# Patient Record
Sex: Male | Born: 1990 | Race: Black or African American | Hispanic: No | Marital: Single | Smoking: Current some day smoker
Health system: Southern US, Community
[De-identification: ages and names within clinical notes are randomized; demographics above are authoritative.]

---
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
## 2010-12-30 ENCOUNTER — Inpatient Hospital Stay (INDEPENDENT_AMBULATORY_CARE_PROVIDER_SITE_OTHER)
Admission: RE | Admit: 2010-12-30 | Discharge: 2010-12-30 | Disposition: A | Discharge status: Home / Self Care | Payer: Self-pay | Source: Ambulatory Visit | Attending: Family Medicine | Admitting: Family Medicine

## 2010-12-30 DIAGNOSIS — J020 Streptococcal pharyngitis: Secondary | ICD-10-CM

## 2010-12-30 LAB — POCT RAPID STREP A (OFFICE): Streptococcus, Group A Screen (Direct): POSITIVE — AB

## 2011-06-22 LAB — URINALYSIS, ROUTINE W REFLEX MICROSCOPIC
Bilirubin Urine: NEGATIVE
Glucose, UA: NEGATIVE
Hgb urine dipstick: NEGATIVE
Ketones, ur: NEGATIVE
Nitrite: NEGATIVE
Protein, ur: NEGATIVE
Specific Gravity, Urine: 1.03
Urobilinogen, UA: 1
pH: 6.5

## 2011-06-22 LAB — RAPID STREP SCREEN (MED CTR MEBANE ONLY): Streptococcus, Group A Screen (Direct): NEGATIVE

## 2011-07-02 LAB — CULTURE, ROUTINE-ABSCESS: Gram Stain: NONE SEEN

## 2011-07-04 ENCOUNTER — Emergency Department (HOSPITAL_COMMUNITY)
Admission: EM | Admit: 2011-07-04 | Discharge: 2011-07-04 | Disposition: A | Discharge status: Home / Self Care | Payer: Medicaid Other | Attending: Emergency Medicine | Admitting: Emergency Medicine

## 2011-07-04 DIAGNOSIS — H6120 Impacted cerumen, unspecified ear: Secondary | ICD-10-CM | POA: Insufficient documentation

## 2011-07-04 DIAGNOSIS — H9209 Otalgia, unspecified ear: Secondary | ICD-10-CM | POA: Insufficient documentation

## 2011-09-19 ENCOUNTER — Emergency Department (INDEPENDENT_AMBULATORY_CARE_PROVIDER_SITE_OTHER)
Admission: EM | Admit: 2011-09-19 | Discharge: 2011-09-19 | Disposition: A | Discharge status: Home / Self Care | Payer: Medicaid Other | Source: Home / Self Care | Attending: Family Medicine | Admitting: Family Medicine

## 2011-09-19 ENCOUNTER — Emergency Department (INDEPENDENT_AMBULATORY_CARE_PROVIDER_SITE_OTHER): Payer: Medicaid Other

## 2011-09-19 DIAGNOSIS — S93409A Sprain of unspecified ligament of unspecified ankle, initial encounter: Secondary | ICD-10-CM

## 2011-09-19 MED ORDER — IBUPROFEN 800 MG PO TABS: 800.0000 mg | ORAL_TABLET | Freq: Three times a day (TID) | ORAL | Status: AC

## 2011-09-19 NOTE — ED Provider Notes (Signed)
History     CSN: [id number] Arrival date & time: 09/19/2011  6:47 PM   First MD Initiated Contact with Patient 09/19/11 1854      Chief Complaint  Patient presents with  . Ankle Injury    (Consider location/radiation/quality/duration/timing/severity/associated sxs/prior treatment) Patient is a 20 y.o. male presenting with lower extremity injury. The history is provided by the patient. No language interpreter was used.  Ankle Injury This is a new problem. The current episode started 6 to 12 hours ago. The problem occurs constantly. The problem has not changed since onset.The symptoms are aggravated by walking. The symptoms are relieved by relaxation. He has tried rest for the symptoms. The treatment provided no relief.  Pt turned ankle walking on gravel.  Pt complains of swelling and pain.   Past Medical History  Diagnosis Date  . Asthma     History reviewed. No pertinent past surgical history.  No family history on file.  History  Substance Use Topics  . Smoking status: Never Smoker   . Smokeless tobacco: Not on file  . Alcohol Use: No      Review of Systems  Musculoskeletal: Positive for myalgias and joint swelling.  All other systems reviewed and are negative.    Allergies  Review of patient's allergies indicates no known allergies.  Home Medications   Current Outpatient Rx  Name Route Sig Dispense Refill  . ALBUTEROL IN Inhalation Inhale into the lungs as needed.        BP 134/85  Pulse 95  Temp(Src) 98.3 F (36.8 C) (Oral)  Resp 18  SpO2 98%  Physical Exam  Vitals reviewed. Constitutional: He appears well-developed and well-nourished.  HENT:  Head: Normocephalic.  Musculoskeletal: He exhibits edema and tenderness.       Swollen left ankle,  Decreased range of motion,  nv and ns intact, tender achilles,  Achilles intact  Neurological: He is alert.  Skin: Skin is warm and dry.  Psychiatric: He has a normal mood and affect.    ED Course    Procedures (including critical care time)  Labs Reviewed - No data to display Dg Ankle Complete Left  09/19/2011  *RADIOLOGY REPORT*  Clinical Data: Ankle injury.  Pain and swelling  LEFT ANKLE COMPLETE - 3+ VIEW  Comparison: None.  Findings: Negative for fracture.  Normal alignment.  Normal joint space.  Soft tissue swelling is present.  IMPRESSION: Negative for fracture.  Original Report Authenticated By: Camelia Phenes, M.D.     No diagnosis found.    MDM  No fx,  Pt placed in aso and crutches.  I advised follow up with Dr. Ave Filter for recheck in 1 week.        Langston Masker, [place] 09/19/11 [phone]

## 2011-09-19 NOTE — ED Notes (Signed)
Pt states he rolled his lt ankle while walking on gravel surface at work on Sunday morning.  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  C/o pain and swelling to lt ankle.  Swelling and pain is worse in medial aspect of lt ankle.  Increased pain with weight bearing.

## 2011-09-20 NOTE — ED Provider Notes (Signed)
Medical screening examination/treatment/procedure(s) were performed by non-physician practitioner and as supervising physician I was immediately available for consultation/collaboration.   Shakesha Soltau DOUGLAS MD.    Ellanora Rayborn Douglas Arianny Pun, MD 09/20/11 1703 

## 2012-07-20 ENCOUNTER — Encounter (HOSPITAL_COMMUNITY): Payer: Self-pay | Admitting: Emergency Medicine

## 2012-07-20 ENCOUNTER — Emergency Department (HOSPITAL_COMMUNITY): Payer: Self-pay

## 2012-07-20 ENCOUNTER — Emergency Department (HOSPITAL_COMMUNITY)
Admission: EM | Admit: 2012-07-20 | Discharge: 2012-07-20 | Disposition: A | Discharge status: Home / Self Care | Payer: Self-pay | Attending: Emergency Medicine | Admitting: Emergency Medicine

## 2012-07-20 DIAGNOSIS — R079 Chest pain, unspecified: Secondary | ICD-10-CM | POA: Insufficient documentation

## 2012-07-20 DIAGNOSIS — R0602 Shortness of breath: Secondary | ICD-10-CM | POA: Insufficient documentation

## 2012-07-20 DIAGNOSIS — R110 Nausea: Secondary | ICD-10-CM | POA: Insufficient documentation

## 2012-07-20 DIAGNOSIS — J189 Pneumonia, unspecified organism: Secondary | ICD-10-CM | POA: Insufficient documentation

## 2012-07-20 DIAGNOSIS — R000 Tachycardia, unspecified: Secondary | ICD-10-CM | POA: Insufficient documentation

## 2012-07-20 DIAGNOSIS — R509 Fever, unspecified: Secondary | ICD-10-CM | POA: Insufficient documentation

## 2012-07-20 LAB — CBC WITH DIFFERENTIAL/PLATELET
Basophils Absolute: 0 10*3/uL (ref 0.0–0.1)
Eosinophils Absolute: 0 10*3/uL (ref 0.0–0.7)
Lymphocytes Relative: 13 % (ref 12–46)
Lymphs Abs: 1.1 10*3/uL (ref 0.7–4.0)
MCH: 28.8 pg (ref 26.0–34.0)
Neutrophils Relative %: 71 % (ref 43–77)
Platelets: 445 10*3/uL — ABNORMAL HIGH (ref 150–400)
RBC: 5.2 MIL/uL (ref 4.22–5.81)
RDW: 12.9 % (ref 11.5–15.5)
WBC: 8 10*3/uL (ref 4.0–10.5)

## 2012-07-20 LAB — BASIC METABOLIC PANEL
Calcium: 9.4 mg/dL (ref 8.4–10.5)
GFR calc non Af Amer: >90 mL/min (ref >90)
Glucose, Bld: 99 mg/dL (ref 70–99)
Potassium: 4.1 mEq/L (ref 3.5–5.1)
Sodium: 139 mEq/L (ref 135–145)

## 2012-07-20 LAB — POCT I-STAT TROPONIN I: Troponin i, poc: 0.01 ng/mL (ref 0.00–0.08)

## 2012-07-20 MED ORDER — ACETAMINOPHEN 325 MG PO TABS
650.0000 mg | ORAL_TABLET | Freq: Once | ORAL | Status: AC
  Administered 2012-07-20: 650 mg via ORAL
  Filled 2012-07-20: qty 2

## 2012-07-20 MED ORDER — AZITHROMYCIN 250 MG PO TABS: ORAL_TABLET | ORAL | Status: DC

## 2012-07-20 MED ORDER — SODIUM CHLORIDE 0.9 % IV BOLUS (SEPSIS): 1000.0000 mL | Freq: Once | INTRAVENOUS | Status: DC

## 2012-07-20 MED ORDER — SODIUM CHLORIDE 0.9 % IV BOLUS (SEPSIS)
1000.0000 mL | Freq: Once | INTRAVENOUS | Status: AC
  Administered 2012-07-20: 1000 mL via INTRAVENOUS

## 2012-07-20 MED ORDER — IBUPROFEN 200 MG PO TABS
600.0000 mg | ORAL_TABLET | Freq: Once | ORAL | Status: AC
  Administered 2012-07-20: 600 mg via ORAL
  Filled 2012-07-20: qty 3

## 2012-07-20 MED ORDER — DEXTROSE 5 % IV SOLN
500.0000 mg | Freq: Once | INTRAVENOUS | Status: AC
  Administered 2012-07-20: 500 mg via INTRAVENOUS
  Filled 2012-07-20: qty 500

## 2012-07-20 NOTE — ED Notes (Signed)
Pt c/o left chest pain that radiates from left side to  Left upper chest onset today when he got to work. Pt also reports nausea.

## 2012-07-20 NOTE — ED Provider Notes (Signed)
History     CSN: [id number]  Arrival date & time 07/20/12  1555   First MD Initiated Contact with Patient 07/20/12 2021      Chief Complaint  Patient presents with  . Chest Pain    (Consider location/radiation/quality/duration/timing/severity/associated sxs/prior treatment) HPI Comments: 21 y/o male presents to the ED complaining of sudden onset left sided lateral chest pain radiating across his left upper chest to the right side while at work today. He works at [employer]. States pain is sharp and intermittent, coming and going every hour. Admits to fever, chills, nausea, shortness of breath on exertion and non-productive cough. Denies vomiting, dizziness, abdominal pain, rashes, urinary symptoms or confusion. Has not tried any alleviating factors for his pain. Admits to being a smoker. Denies any sick contacts.  Patient is a 21 y.o. male presenting with chest pain. The history is provided by the patient.  Chest Pain Primary symptoms include a fever, shortness of breath, cough and nausea. Pertinent negatives for primary symptoms include no abdominal pain, no vomiting and no dizziness.  Pertinent negatives for associated symptoms include no weakness.     Past Medical History  Diagnosis Date  . Asthma     History reviewed. No pertinent past surgical history.  No family history on file.  History  Substance Use Topics  . Smoking status: Current Some Day Smoker    Types: Cigarettes  . Smokeless tobacco: Not on file  . Alcohol Use: No      Review of Systems  Constitutional: Positive for fever and chills.  HENT: Negative for congestion, neck stiffness and ear discharge.   Respiratory: Positive for cough and shortness of breath.   Cardiovascular: Positive for chest pain.  Gastrointestinal: Positive for nausea. Negative for vomiting and abdominal pain.  Genitourinary: Negative for dysuria and difficulty urinating.  Musculoskeletal: Negative for back pain.  Skin: Negative  for rash.  Neurological: Negative for dizziness, weakness and light-headedness.  Psychiatric/Behavioral: Negative for confusion.    Allergies  Review of patient's allergies indicates no known allergies.  Home Medications   Current Outpatient Rx  Name Route Sig Dispense Refill  . IBUPROFEN 200 MG PO TABS Oral Take 400 mg by mouth every 6 (six) hours as needed. For pain      BP 134/85  Pulse 108  Temp 103 F (39.4 C) (Oral)  Resp 18  SpO2 99%  Physical Exam  Nursing note and vitals reviewed. Constitutional: He is oriented to person, place, and time. He appears well-developed. No distress.       Obese  HENT:  Head: Normocephalic and atraumatic.  Nose: Nose normal.  Mouth/Throat: Oropharynx is clear and moist. No oropharyngeal exudate.  Eyes: Conjunctivae normal and EOM are normal.  Neck: Normal range of motion. Neck supple.  Cardiovascular: Regular rhythm, normal heart sounds and intact distal pulses.  Tachycardia present.   Pulmonary/Chest: Effort normal. No respiratory distress. He has no decreased breath sounds. He has rhonchi (right mid-lower lung fields posterior > anterior). He exhibits no tenderness.  Abdominal: Soft. Bowel sounds are normal. There is no tenderness.  Musculoskeletal: Normal range of motion.  Neurological: He is alert and oriented to person, place, and time.  Skin: Skin is warm and intact. No rash noted. No cyanosis.       Very warm  Psychiatric: He has a normal mood and affect. His behavior is normal.    ED Course  Procedures (including critical care time)  Labs Reviewed  CBC WITH DIFFERENTIAL - Abnormal; Notable  for the following:    Platelets 445 (*)     Monocytes Relative 15 (*)     Monocytes Absolute 1.2 (*)     All other components within normal limits  BASIC METABOLIC PANEL  POCT I-STAT TROPONIN I   Dg Chest 2 View  07/20/2012  *RADIOLOGY REPORT*  Clinical Data: Chest pain and shortness of breath and cough.  CHEST - 2 VIEW   Comparison: 10/22/2007  Findings: The patient has a consolidative pneumonia in the lateral segment of the right middle lobe.  Heart size and vascularity are normal.  Left lung is clear.  No effusions.  IMPRESSION: Right middle lobe pneumonia.   Original Report Authenticated By: Gwynn Burly, M.D.      1. Community acquired pneumonia   2. Fever       MDM  21 y/o male with right middle lobe pneumonia. Azithromycin IV given in ED. Azithromycin PO given for discharge. Ibuprofen given for fever control and temp at discharge 98.4. He states he is feeling better. He appears in NAD and is in no respiratory distress. Close return precautions discussed. Advised f/u with PCP in 1 week.        Trevor Mace, PA-C 07/20/12 2221

## 2012-07-20 NOTE — ED Provider Notes (Signed)
Medical screening examination/treatment/procedure(s) were performed by non-physician practitioner and as supervising physician I was immediately available for consultation/collaboration.  Cheri Guppy, MD 07/20/12 [phone]

## 2012-12-15 IMAGING — CR DG CHEST 2V
2 series · 2 of 2 positions shown · non-contrast
Comparison: 10/22/2007

CLINICAL DATA: Chest pain and shortness of breath and cough.

CHEST - 2 VIEW

[w chest pa]
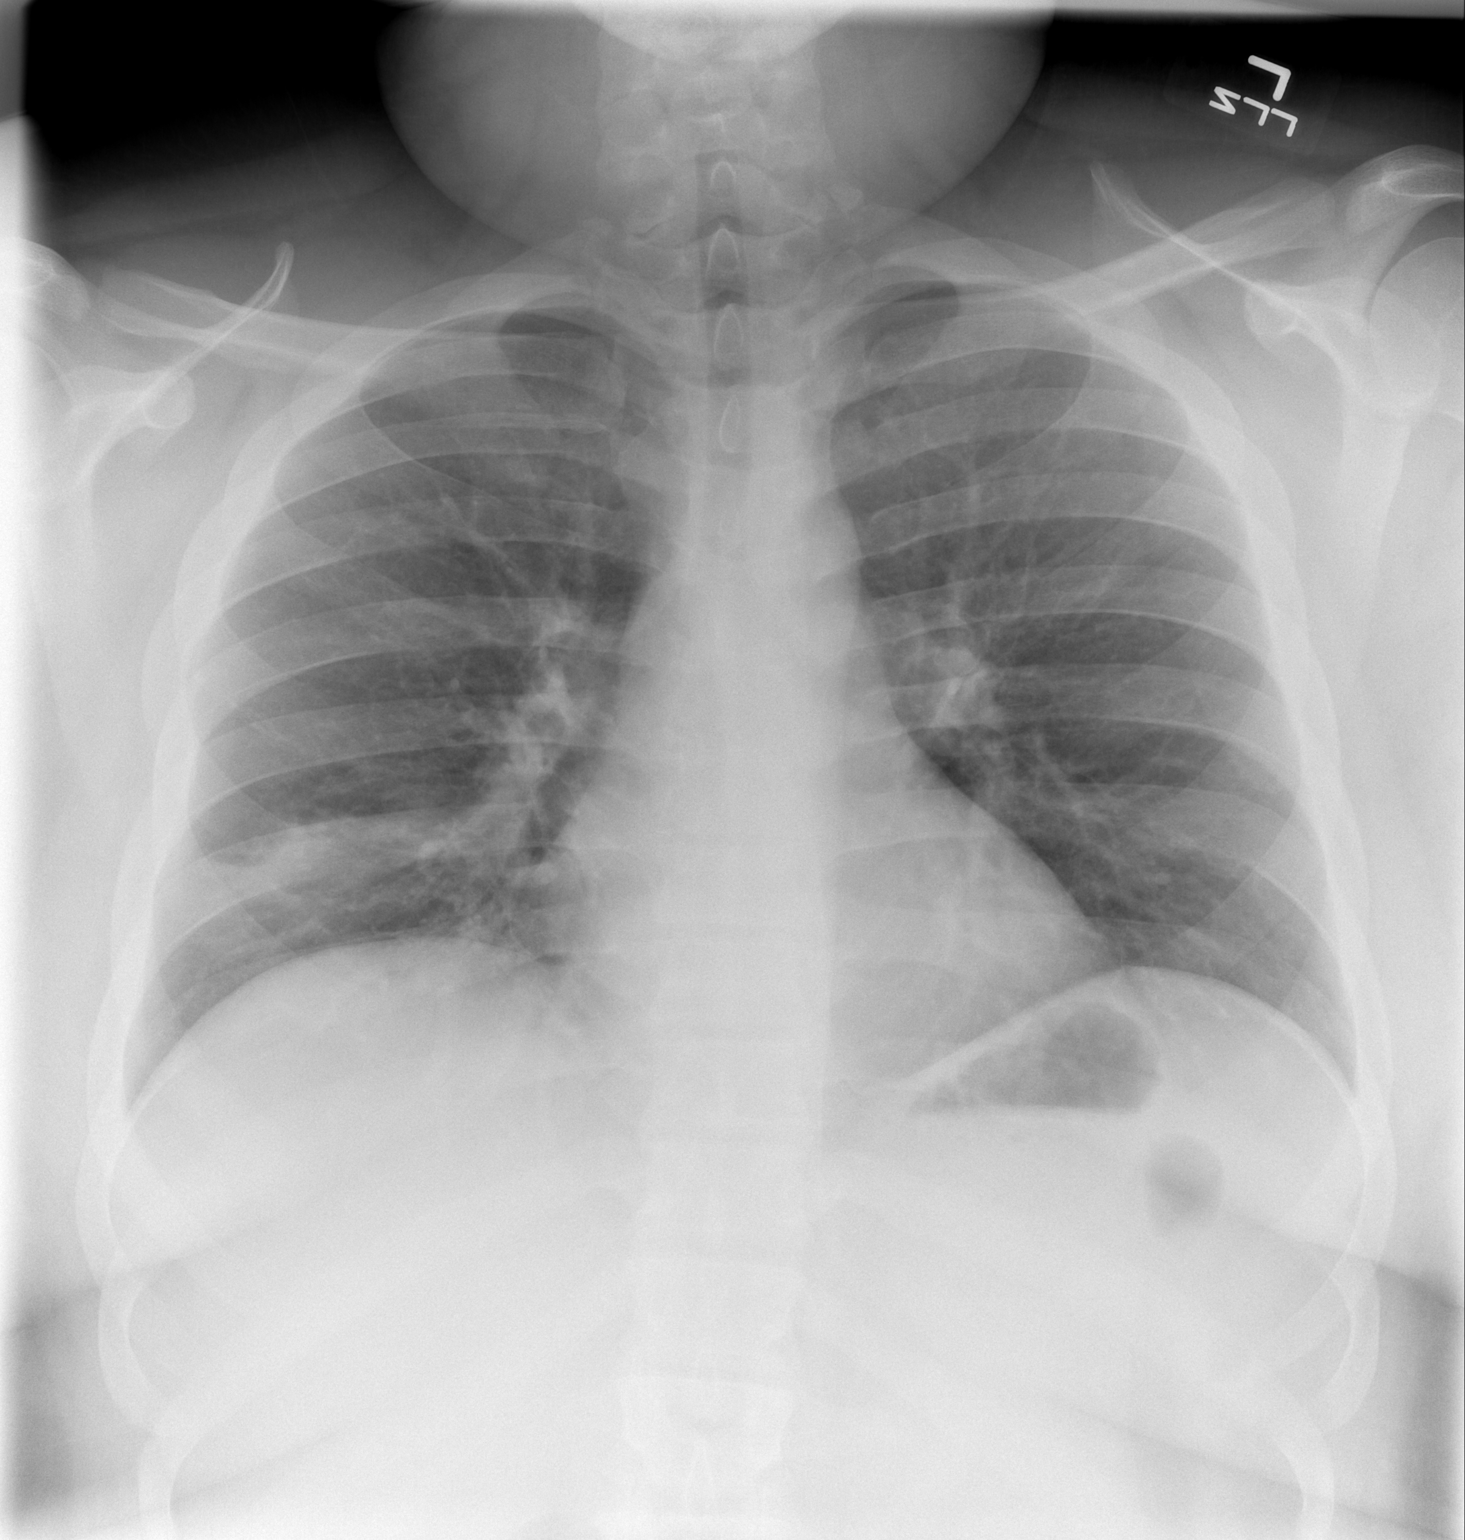

[w chest lat]
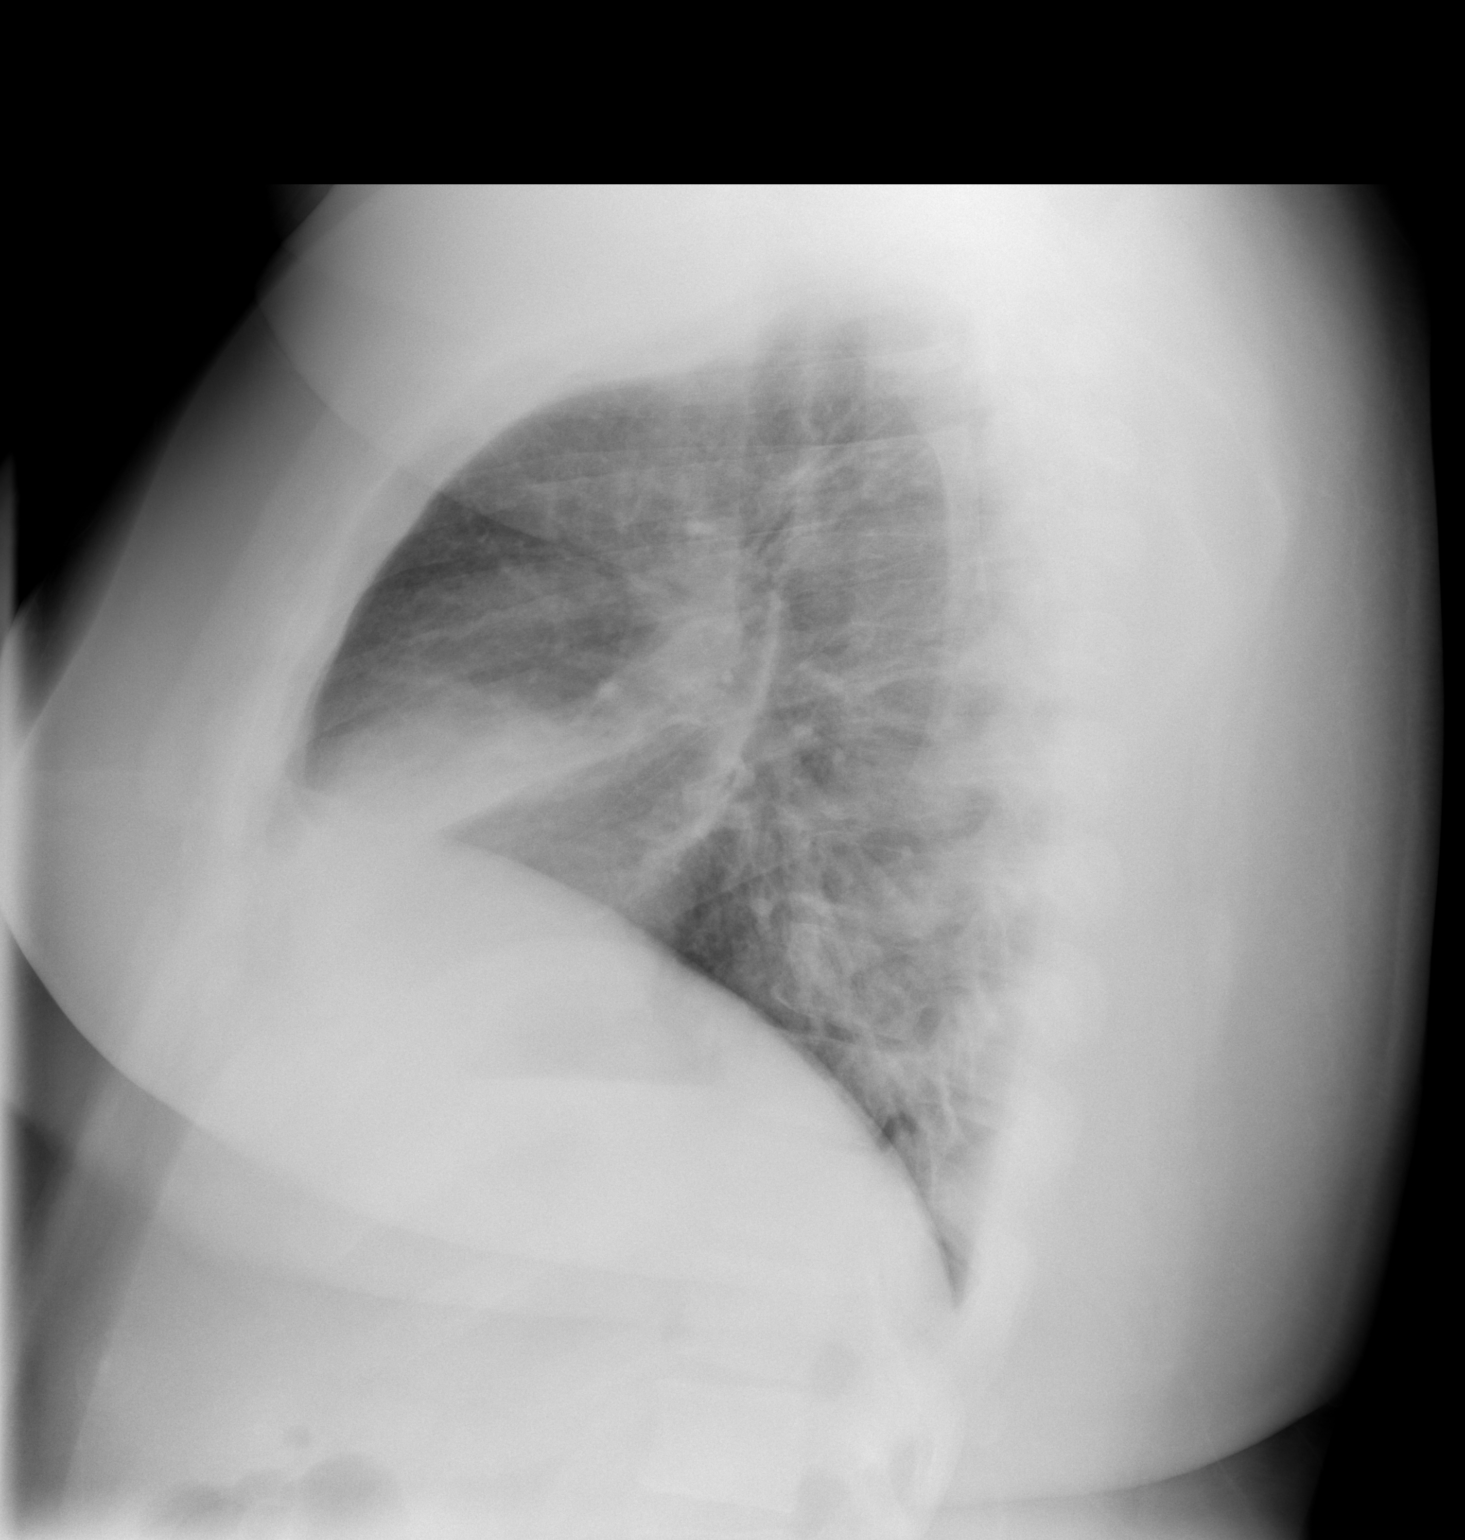

[2 of 2 positions shown; findings below may reference images not displayed]

FINDINGS: The patient has a consolidative pneumonia in the lateral
segment of the right middle lobe.  Heart size and vascularity are
normal.  Left lung is clear.  No effusions.
IMPRESSION: Right middle lobe pneumonia.

## 2013-04-19 ENCOUNTER — Emergency Department (INDEPENDENT_AMBULATORY_CARE_PROVIDER_SITE_OTHER)
Admission: EM | Admit: 2013-04-19 | Discharge: 2013-04-19 | Disposition: A | Discharge status: Home / Self Care | Payer: Self-pay | Source: Home / Self Care

## 2013-04-19 ENCOUNTER — Encounter (HOSPITAL_COMMUNITY): Payer: Self-pay

## 2013-04-19 DIAGNOSIS — M25512 Pain in left shoulder: Secondary | ICD-10-CM

## 2013-04-19 DIAGNOSIS — M25519 Pain in unspecified shoulder: Secondary | ICD-10-CM

## 2013-04-19 DIAGNOSIS — R109 Unspecified abdominal pain: Secondary | ICD-10-CM

## 2013-04-19 MED ORDER — MELOXICAM 15 MG PO TABS: 15.0000 mg | ORAL_TABLET | Freq: Every day | ORAL | Status: AC

## 2013-04-19 MED ORDER — OMEPRAZOLE 20 MG PO CPDR: 20.0000 mg | DELAYED_RELEASE_CAPSULE | Freq: Every day | ORAL | Status: AC

## 2013-04-19 NOTE — ED Provider Notes (Signed)
Jorge Estes is a 22 y.o. male who presents to Urgent Care today for bilateral right worse than left [place] joint pain.  Pain present for 4 days. Patient fell playing basketball yesterday which worsened the pain. He notes the pain is worse with overhand motion and better with rest. He denies any chest pains palpitations or trouble breathing. He has not tried any medications.  Additionally he notes some upper abdominal discomfort and nausea in the mornings. It is not affected by diet. He denies any nausea vomiting or diarrhea or blood in the stool. He has not tried any medications and feels well otherwise.    PMH reviewed. Obese History  Substance Use Topics  . Smoking status: Current Some Day Smoker    Types: Cigarettes  . Smokeless tobacco: Not on file  . Alcohol Use: No   ROS as above Medications reviewed. No current facility-administered medications for this encounter.   Current Outpatient Prescriptions  Medication Sig Dispense Refill  . meloxicam (MOBIC) 15 MG tablet Take 1 tablet (15 mg total) by mouth daily.  30 tablet  0  . omeprazole (PRILOSEC) 20 MG capsule Take 1 capsule (20 mg total) by mouth daily.  30 capsule  2    Exam:  BP 115/69  Pulse 87  Temp(Src) 98.4 F (36.9 C) (Oral)  Resp 17  SpO2 100% Gen: Well NAD HEENT: EOMI,  MMM Lungs: CTABL Nl WOB Heart: RRR no MRG Abd: NABS, NT, ND Exts: Non edematous BL  LE, warm and well perfused.  MSK: Bilateral shoulder exam. Normal-appearing nontender a.c. joints. Bilateral mildly tender [place] joints. Decreased abduction range of motion due to pain. External and internal rotation are normal. Strength is intact in all planes. Negative impingement testing.  Capillary refill sensation and grip strength are intact distally.   No results found for this or any previous visit (from the past 24 hour(s)). No results found.  Assessment and Plan: 22 y.o. male with  1) [place] shoulder pain. Unclear cause. Likely related to  activities. Doubtful for serious process his vital signs are normal chest exam is normal.  Plan for meloxicam for pain control.   2) abdominal pain with nausea: Likely GERD related. Possible gallstone.  Plan for empiric treatment with omeprazole. If not improving will followup with primary care provider.   Discussed warning signs or symptoms. Please see discharge instructions. Patient expresses understanding.      Rodolph Bong, MD 04/19/13 1758

## 2013-04-19 NOTE — ED Notes (Signed)
Bilateral shoulder pain, patients states he fell yesterday playing basketball, however he had shoulder pain prior to fall

## 2014-07-07 ENCOUNTER — Emergency Department (INDEPENDENT_AMBULATORY_CARE_PROVIDER_SITE_OTHER): Payer: Self-pay

## 2014-07-07 ENCOUNTER — Emergency Department (INDEPENDENT_AMBULATORY_CARE_PROVIDER_SITE_OTHER)
Admission: EM | Admit: 2014-07-07 | Discharge: 2014-07-07 | Disposition: A | Discharge status: Home / Self Care | Payer: Self-pay | Source: Home / Self Care | Attending: Family Medicine | Admitting: Family Medicine

## 2014-07-07 ENCOUNTER — Encounter (HOSPITAL_COMMUNITY): Payer: Self-pay | Admitting: Emergency Medicine

## 2014-07-07 DIAGNOSIS — S60221A Contusion of right hand, initial encounter: Secondary | ICD-10-CM

## 2014-07-07 DIAGNOSIS — S63501A Unspecified sprain of right wrist, initial encounter: Secondary | ICD-10-CM

## 2014-07-07 MED ORDER — IBUPROFEN 800 MG PO TABS
800.0000 mg | ORAL_TABLET | Freq: Once | ORAL | Status: AC
  Administered 2014-07-07: 800 mg via ORAL

## 2014-07-07 MED ORDER — IBUPROFEN 600 MG PO TABS: 600.0000 mg | ORAL_TABLET | Freq: Three times a day (TID) | ORAL | Status: DC | PRN

## 2014-07-07 MED ORDER — IBUPROFEN 800 MG PO TABS
ORAL_TABLET | ORAL | Status: AC
  Filled 2014-07-07: qty 1

## 2014-07-07 NOTE — Discharge Instructions (Signed)
Contusion °A contusion is a deep bruise. Contusions are the result of an injury that caused bleeding under the skin. The contusion may turn blue, purple, or yellow. Minor injuries will give you a painless contusion, but more severe contusions may stay painful and swollen for a few weeks.  °CAUSES  °A contusion is usually caused by a blow, trauma, or direct force to an area of the body. °SYMPTOMS  °· Swelling and redness of the injured area. °· Bruising of the injured area. °· Tenderness and soreness of the injured area. °· Pain. °DIAGNOSIS  °The diagnosis can be made by taking a history and physical exam. An X-ray, CT scan, or MRI may be needed to determine if there were any associated injuries, such as fractures. °TREATMENT  °Specific treatment will depend on what area of the body was injured. In general, the best treatment for a contusion is resting, icing, elevating, and applying cold compresses to the injured area. Over-the-counter medicines may also be recommended for pain control. Ask your caregiver what the best treatment is for your contusion. °HOME CARE INSTRUCTIONS  °· Put ice on the injured area. °¨ Put ice in a plastic bag. °¨ Place a towel between your skin and the bag. °¨ Leave the ice on for 15-20 minutes, 3-4 times a day, or as directed by your health care provider. °· Only take over-the-counter or prescription medicines for pain, discomfort, or fever as directed by your caregiver. Your caregiver may recommend avoiding anti-inflammatory medicines (aspirin, ibuprofen, and naproxen) for 48 hours because these medicines may increase bruising. °· Rest the injured area. °· If possible, elevate the injured area to reduce swelling. °SEEK IMMEDIATE MEDICAL CARE IF:  °· You have increased bruising or swelling. °· You have pain that is getting worse. °· Your swelling or pain is not relieved with medicines. °MAKE SURE YOU:  °· Understand these instructions. °· Will watch your condition. °· Will get help right  away if you are not doing well or get worse. °Document Released: 06/27/2005 Document Revised: 09/22/2013 Document Reviewed: 07/23/2011 °ExitCare® Patient Information ©2015 [employer]. This information is not intended to replace advice given to you by your health care provider. Make sure you discuss any questions you have with your health care provider. ° ° °Hand Contusion °A hand contusion is a deep bruise on your hand area. Contusions are the result of an injury that caused bleeding under the skin. The contusion may turn blue, purple, or yellow. Minor injuries will give you a painless contusion, but more severe contusions may stay painful and swollen for a few weeks. °CAUSES  °A contusion is usually caused by a blow, trauma, or direct force to an area of the body. °SYMPTOMS  °· Swelling and redness of the injured area. °· Discoloration of the injured area. °· Tenderness and soreness of the injured area. °· Pain. °DIAGNOSIS  °The diagnosis can be made by taking a history and performing a physical exam. An X-ray, CT scan, or MRI may be needed to determine if there were any associated injuries, such as broken bones (fractures). °TREATMENT  °Often, the best treatment for a hand contusion is resting, elevating, icing, and applying cold compresses to the injured area. Over-the-counter medicines may also be recommended for pain control. °HOME CARE INSTRUCTIONS  °· Put ice on the injured area. °¨ Put ice in a plastic bag. °¨ Place a towel between your skin and the bag. °¨ Leave the ice on for 15-20 minutes, 03-04 times a day. °·   Only take over-the-counter or prescription medicines as directed by your caregiver. Your caregiver may recommend avoiding anti-inflammatory medicines (aspirin, ibuprofen, and naproxen) for 48 hours because these medicines may increase bruising. °· If told, use an elastic wrap as directed. This can help reduce swelling. You may remove the wrap for sleeping, showering, and bathing. If your fingers  become numb, cold, or blue, take the wrap off and reapply it more loosely. °· Elevate your hand with pillows to reduce swelling. °· Avoid overusing your hand if it is painful. °SEEK IMMEDIATE MEDICAL CARE IF:  °· You have increased redness, swelling, or pain in your hand. °· Your swelling or pain is not relieved with medicines. °· You have loss of feeling in your hand or are unable to move your fingers. °· Your hand turns cold or blue. °· You have pain when you move your fingers. °· Your hand becomes warm to the touch. °· Your contusion does not improve in 2 days. °MAKE SURE YOU:  °· Understand these instructions. °· Will watch your condition. °· Will get help right away if you are not doing well or get worse. °Document Released: 03/09/2002 Document Revised: 06/11/2012 Document Reviewed: 03/10/2012 °ExitCare® Patient Information ©2015 [employer]. This information is not intended to replace advice given to you by your health care provider. Make sure you discuss any questions you have with your health care provider. ° °

## 2014-07-07 NOTE — ED Provider Notes (Signed)
CSN: [id number]     Arrival date & time 07/07/14  1478 History   First MD Initiated Contact with Patient 07/07/14 [phone]     Chief Complaint  Patient presents with  . Arm Injury   (Consider location/radiation/quality/duration/timing/severity/associated sxs/prior Treatment) Patient is a 23 y.o. male presenting with hand injury. The history is provided by the patient.  Hand Injury Location:  Hand and wrist Time since incident:  12 hours Injury: yes   Mechanism of injury comment:  Punched wall last night Wrist location:  R wrist Hand location:  R hand Chronicity:  New Handedness:  Right-handed Dislocation: no   Prior injury to area:  No   Past Medical History  Diagnosis Date  . Asthma    History reviewed. No pertinent past surgical history. History reviewed. No pertinent family history. History  Substance Use Topics  . Smoking status: Current Some Day Smoker    Types: Cigarettes  . Smokeless tobacco: Not on file  . Alcohol Use: No    Review of Systems  All other systems reviewed and are negative.   Allergies  Review of patient's allergies indicates no known allergies.  Home Medications   Prior to Admission medications   Medication Sig Start Date End Date Taking? Authorizing Provider  ibuprofen (ADVIL,MOTRIN) 600 MG tablet Take 1 tablet (600 mg total) by mouth every 8 (eight) hours as needed for mild pain or moderate pain. 07/07/14   Mathis Fare Valencia Kassa, PA  meloxicam (MOBIC) 15 MG tablet Take 1 tablet (15 mg total) by mouth daily. 04/19/13   Rodolph Bong, MD  omeprazole (PRILOSEC) 20 MG capsule Take 1 capsule (20 mg total) by mouth daily. 04/19/13   Rodolph Bong, MD   BP 127/73  Pulse 69  Temp(Src) 98.2 F (36.8 C) (Oral)  SpO2 98% Physical Exam  Nursing note and vitals reviewed. Constitutional: He is oriented to person, place, and time. He appears well-developed and well-nourished. No distress.  HENT:  Head: Normocephalic and atraumatic.  Eyes: Conjunctivae  are normal. No scleral icterus.  Cardiovascular: Normal rate.   Pulmonary/Chest: Effort normal.  Musculoskeletal:       Right hand: He exhibits normal capillary refill. Normal sensation noted. Normal strength noted.       Hands: CSM exam of right hand normal  Neurological: He is alert and oriented to person, place, and time.  Skin: Skin is warm and dry. No rash noted. No erythema.  +intact  Psychiatric: He has a normal mood and affect. His behavior is normal.    ED Course  Procedures (including critical care time) Labs Review Labs Reviewed - No data to display  Imaging Review Dg Wrist Complete Right  07/07/2014   CLINICAL DATA:  Patient struck a car door and car window with the right fist. Pain in the metacarpals. Wrist pain.  EXAM: RIGHT WRIST - COMPLETE 3+ VIEW  COMPARISON:  07/07/2014  FINDINGS: The carpus and articulation between the carpal and metacarpal bones appear intact. No visible scaphoid fracture. Distal radius and distal ulna appear intact.  We get a second look at the metacarpals on this exam, and I still do not observe a discrete fracture.  IMPRESSION: 1. No acute bony findings are identified.   Electronically Signed   By: Herbie Baltimore M.D.   On: 07/07/2014 09:30   Dg Hand Complete Right  07/07/2014   CLINICAL DATA:  Patient struck a car door with right fist. Metacarpal pain.  EXAM: RIGHT HAND - COMPLETE 3+ VIEW  COMPARISON:  None.  FINDINGS: Soft tissue swelling dorsal to the MCP joints without a definite boxer's fracture or other fracture identified. No dislocation noted.  IMPRESSION: 1. Negative for fracture.  Soft tissue swelling noted.   Electronically Signed   By: Herbie BaltimoreWalt  Liebkemann M.D.   On: 07/07/2014 09:22     MDM   1. Hand contusion, right, initial encounter   2. Right wrist sprain, initial encounter   Films negative for acute fracture. Splint and RICE therapy with ibuprofen for pain and hand ortho follow up if no improvement over next 1-2 weeks.      Ria ClockJennifer Lee H Augustine Leverette, [place] 07/07/14 [phone]

## 2014-07-07 NOTE — ED Notes (Signed)
States he struck hard object w right arm last PM, has pain in right hand wrist

## 2014-07-09 NOTE — ED Provider Notes (Signed)
Medical screening examination/treatment/procedure(s) were performed by a resident physician or non-physician practitioner and as the supervising physician I was immediately available for consultation/collaboration.  Clementeen GrahamEvan Emie Sommerfeld, MD    Rodolph BongEvan S Maan Zarcone, MD 07/09/14 [phone]

## 2014-08-06 ENCOUNTER — Encounter (HOSPITAL_COMMUNITY): Payer: Self-pay | Admitting: Emergency Medicine

## 2014-08-06 ENCOUNTER — Emergency Department (HOSPITAL_COMMUNITY): Payer: No Typology Code available for payment source

## 2014-08-06 ENCOUNTER — Emergency Department (HOSPITAL_COMMUNITY)
Admission: EM | Admit: 2014-08-06 | Discharge: 2014-08-06 | Disposition: A | Discharge status: Home / Self Care | Payer: No Typology Code available for payment source | Attending: Emergency Medicine | Admitting: Emergency Medicine

## 2014-08-06 DIAGNOSIS — T22132A Burn of first degree of left upper arm, initial encounter: Secondary | ICD-10-CM | POA: Diagnosis not present

## 2014-08-06 DIAGNOSIS — S3991XA Unspecified injury of abdomen, initial encounter: Secondary | ICD-10-CM | POA: Diagnosis not present

## 2014-08-06 DIAGNOSIS — Z720 Tobacco use: Secondary | ICD-10-CM | POA: Diagnosis not present

## 2014-08-06 DIAGNOSIS — Y92410 Unspecified street and highway as the place of occurrence of the external cause: Secondary | ICD-10-CM | POA: Diagnosis not present

## 2014-08-06 DIAGNOSIS — Z79899 Other long term (current) drug therapy: Secondary | ICD-10-CM | POA: Insufficient documentation

## 2014-08-06 DIAGNOSIS — X19XXXA Contact with other heat and hot substances, initial encounter: Secondary | ICD-10-CM | POA: Insufficient documentation

## 2014-08-06 DIAGNOSIS — Y9389 Activity, other specified: Secondary | ICD-10-CM | POA: Insufficient documentation

## 2014-08-06 DIAGNOSIS — S4992XA Unspecified injury of left shoulder and upper arm, initial encounter: Secondary | ICD-10-CM | POA: Diagnosis not present

## 2014-08-06 DIAGNOSIS — J45909 Unspecified asthma, uncomplicated: Secondary | ICD-10-CM | POA: Insufficient documentation

## 2014-08-06 DIAGNOSIS — R1010 Upper abdominal pain, unspecified: Secondary | ICD-10-CM

## 2014-08-06 DIAGNOSIS — T2210XA Burn of first degree of shoulder and upper limb, except wrist and hand, unspecified site, initial encounter: Secondary | ICD-10-CM

## 2014-08-06 DIAGNOSIS — R110 Nausea: Secondary | ICD-10-CM

## 2014-08-06 DIAGNOSIS — S3992XA Unspecified injury of lower back, initial encounter: Secondary | ICD-10-CM | POA: Insufficient documentation

## 2014-08-06 DIAGNOSIS — S299XXA Unspecified injury of thorax, initial encounter: Secondary | ICD-10-CM | POA: Diagnosis not present

## 2014-08-06 DIAGNOSIS — M545 Low back pain, unspecified: Secondary | ICD-10-CM

## 2014-08-06 DIAGNOSIS — T22019A Burn of unspecified degree of unspecified forearm, initial encounter: Secondary | ICD-10-CM | POA: Diagnosis present

## 2014-08-06 DIAGNOSIS — Z791 Long term (current) use of non-steroidal anti-inflammatories (NSAID): Secondary | ICD-10-CM | POA: Insufficient documentation

## 2014-08-06 DIAGNOSIS — R0789 Other chest pain: Secondary | ICD-10-CM

## 2014-08-06 MED ORDER — ONDANSETRON HCL 8 MG PO TABS: 8.0000 mg | ORAL_TABLET | Freq: Three times a day (TID) | ORAL | Status: AC | PRN

## 2014-08-06 MED ORDER — CYCLOBENZAPRINE HCL 10 MG PO TABS: 10.0000 mg | ORAL_TABLET | Freq: Three times a day (TID) | ORAL | Status: AC | PRN

## 2014-08-06 MED ORDER — NAPROXEN 500 MG PO TABS: 500.0000 mg | ORAL_TABLET | Freq: Two times a day (BID) | ORAL | Status: DC | PRN

## 2014-08-06 MED ORDER — HYDROCODONE-ACETAMINOPHEN 5-325 MG PO TABS
1.0000 | ORAL_TABLET | Freq: Once | ORAL | Status: AC
  Administered 2014-08-06: 1 via ORAL
  Filled 2014-08-06: qty 1

## 2014-08-06 MED ORDER — HYDROCODONE-ACETAMINOPHEN 5-325 MG PO TABS: 1.0000 | ORAL_TABLET | Freq: Four times a day (QID) | ORAL | Status: DC | PRN

## 2014-08-06 MED ORDER — LIDOCAINE HCL 0.5 % EX GEL: CUTANEOUS | Status: DC

## 2014-08-06 MED ORDER — IOHEXOL 300 MG/ML  SOLN
100.0000 mL | Freq: Once | INTRAMUSCULAR | Status: AC | PRN
  Administered 2014-08-06: 100 mL via INTRAVENOUS
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
## 2014-08-17 ENCOUNTER — Encounter (HOSPITAL_COMMUNITY): Payer: Self-pay | Admitting: Emergency Medicine

## 2014-08-17 ENCOUNTER — Emergency Department (HOSPITAL_COMMUNITY)
Admission: EM | Admit: 2014-08-17 | Discharge: 2014-08-17 | Disposition: A | Discharge status: Home / Self Care | Payer: No Typology Code available for payment source | Attending: Emergency Medicine | Admitting: Emergency Medicine

## 2014-08-17 DIAGNOSIS — R531 Weakness: Secondary | ICD-10-CM | POA: Insufficient documentation

## 2014-08-17 DIAGNOSIS — J45909 Unspecified asthma, uncomplicated: Secondary | ICD-10-CM | POA: Insufficient documentation

## 2014-08-17 DIAGNOSIS — R55 Syncope and collapse: Secondary | ICD-10-CM | POA: Insufficient documentation

## 2014-08-17 DIAGNOSIS — Z79899 Other long term (current) drug therapy: Secondary | ICD-10-CM | POA: Insufficient documentation

## 2014-08-17 DIAGNOSIS — Z52008 Unspecified donor, other blood: Secondary | ICD-10-CM

## 2014-08-17 DIAGNOSIS — Z791 Long term (current) use of non-steroidal anti-inflammatories (NSAID): Secondary | ICD-10-CM | POA: Insufficient documentation

## 2014-08-17 DIAGNOSIS — R5383 Other fatigue: Secondary | ICD-10-CM | POA: Insufficient documentation

## 2014-08-17 DIAGNOSIS — Z52098 Other blood donor, other blood: Secondary | ICD-10-CM | POA: Insufficient documentation

## 2014-08-17 DIAGNOSIS — Z720 Tobacco use: Secondary | ICD-10-CM | POA: Insufficient documentation

## 2014-08-17 LAB — CBC WITH DIFFERENTIAL/PLATELET
BASOS ABS: 0 10*3/uL (ref 0.0–0.1)
Basophils Relative: 1 % (ref 0–1)
EOS PCT: 2 % (ref 0–5)
Eosinophils Absolute: 0.2 10*3/uL (ref 0.0–0.7)
HEMATOCRIT: 45.2 % (ref 39.0–52.0)
Hemoglobin: 15.4 g/dL (ref 13.0–17.0)
LYMPHS PCT: 22 % (ref 12–46)
Lymphs Abs: 1.5 10*3/uL (ref 0.7–4.0)
MCH: 28.6 pg (ref 26.0–34.0)
MCHC: 34.1 g/dL (ref 30.0–36.0)
MCV: 84 fL (ref 78.0–100.0)
MONO ABS: 0.8 10*3/uL (ref 0.1–1.0)
MONOS PCT: 12 % (ref 3–12)
Neutro Abs: 4.3 10*3/uL (ref 1.7–7.7)
Neutrophils Relative %: 63 % (ref 43–77)
Platelets: 374 10*3/uL (ref 150–400)
RBC: 5.38 MIL/uL (ref 4.22–5.81)
RDW: 13 % (ref 11.5–15.5)
WBC: 6.8 10*3/uL (ref 4.0–10.5)

## 2014-08-17 LAB — BASIC METABOLIC PANEL
Anion gap: 11 (ref 5–15)
BUN: 7 mg/dL (ref 6–23)
CO2: 25 meq/L (ref 19–32)
CREATININE: 0.93 mg/dL (ref 0.50–1.35)
Calcium: 8.6 mg/dL (ref 8.4–10.5)
Chloride: 107 mEq/L (ref 96–112)
GFR calc Af Amer: >90 mL/min (ref >90)
GFR calc non Af Amer: >90 mL/min (ref >90)
Glucose, Bld: 121 mg/dL — ABNORMAL HIGH (ref 70–99)
Potassium: 4.2 mEq/L (ref 3.7–5.3)
Sodium: 143 mEq/L (ref 137–147)

## 2014-08-17 MED ORDER — SODIUM CHLORIDE 0.9 % IV BOLUS (SEPSIS)
1000.0000 mL | Freq: Once | INTRAVENOUS | Status: AC
  Administered 2014-08-17: 1000 mL via INTRAVENOUS

## 2014-08-17 NOTE — ED Notes (Signed)
Meal given

## 2014-08-17 NOTE — Discharge Instructions (Signed)
Call for a follow up appointment with a Family or Primary Care Provider.  Return if Symptoms worsen.   Take medication as prescribed.  Drink plenty of fluids.  Eat a well balanced diet. Make sure your drink more water at least the day before and day of plasma donation.
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
## 2014-08-17 NOTE — ED Notes (Signed)
EDP at bedside  
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
## 2014-09-23 ENCOUNTER — Encounter (HOSPITAL_COMMUNITY): Payer: Self-pay | Admitting: *Deleted

## 2014-09-23 ENCOUNTER — Emergency Department (HOSPITAL_COMMUNITY)
Admission: EM | Admit: 2014-09-23 | Discharge: 2014-09-23 | Disposition: A | Discharge status: Home / Self Care | Payer: No Typology Code available for payment source | Attending: Emergency Medicine | Admitting: Emergency Medicine

## 2014-09-23 DIAGNOSIS — H6691 Otitis media, unspecified, right ear: Secondary | ICD-10-CM

## 2014-09-23 DIAGNOSIS — H6591 Unspecified nonsuppurative otitis media, right ear: Secondary | ICD-10-CM | POA: Insufficient documentation

## 2014-09-23 DIAGNOSIS — Z720 Tobacco use: Secondary | ICD-10-CM | POA: Insufficient documentation

## 2014-09-23 DIAGNOSIS — J45909 Unspecified asthma, uncomplicated: Secondary | ICD-10-CM | POA: Insufficient documentation

## 2014-09-23 MED ORDER — IBUPROFEN 800 MG PO TABS
800.0000 mg | ORAL_TABLET | Freq: Once | ORAL | Status: AC
  Administered 2014-09-23: 800 mg via ORAL
  Filled 2014-09-23: qty 1

## 2014-09-23 MED ORDER — AMOXICILLIN 500 MG PO CAPS
500.0000 mg | ORAL_CAPSULE | Freq: Once | ORAL | Status: AC
  Administered 2014-09-23: 500 mg via ORAL
  Filled 2014-09-23: qty 1

## 2014-09-23 MED ORDER — IBUPROFEN 600 MG PO TABS: 600.0000 mg | ORAL_TABLET | Freq: Three times a day (TID) | ORAL | Status: AC | PRN

## 2014-09-23 MED ORDER — AMOXICILLIN 500 MG PO CAPS: 500.0000 mg | ORAL_CAPSULE | Freq: Two times a day (BID) | ORAL | Status: AC

## 2014-09-23 MED ORDER — HYDROCODONE-ACETAMINOPHEN 5-325 MG PO TABS: 1.0000 | ORAL_TABLET | Freq: Four times a day (QID) | ORAL | Status: AC | PRN

## 2014-09-23 NOTE — ED Notes (Signed)
Rt ear pain for 8 hours   Rt facial pain  And numbness  Also.  He denies a toothache

## 2014-09-23 NOTE — ED Provider Notes (Signed)
CSN: [id number]     Arrival date & time 09/23/14  0041 History  This chart was scribe for Jorge Estes M Malakie Balis, MD by Angelene GiovanniEmmanuella Mensah, ED Scribe. The patient was seen in room B14C/B14C and the patient's care was started at 3:16 AM.    Chief Complaint  Patient presents with  . Otalgia   The history is provided by the patient. No language interpreter was used.   HPI Comments: Jorge RobinsonChristopher L Estes is a 23 y.o. male who presents to the Emergency Department complaining of a gradually worsening right otalgia onset 8 hours ago. He denies any associated fever, sinus infection, cough, or rhinorrhea. He denies taking any medication PTA. He also denies a history of ear infections or any allergies to medication.   Past Medical History  Diagnosis Date  . Asthma    History reviewed. No pertinent past surgical history. No family history on file. History  Substance Use Topics  . Smoking status: Current Some Day Smoker    Types: Cigarettes  . Smokeless tobacco: Not on file  . Alcohol Use: No    Review of Systems  Constitutional: Negative for fever.  HENT: Positive for ear discharge and ear pain. Negative for rhinorrhea and sinus pressure.   Respiratory: Negative for cough.       Allergies  Review of patient's allergies indicates no known allergies.  Home Medications   Prior to Admission medications   Medication Sig Start Date End Date Taking? Authorizing Provider  cyclobenzaprine (FLEXERIL) 10 MG tablet Take 1 tablet (10 mg total) by mouth 3 (three) times daily as needed for muscle spasms. Patient not taking: Reported on 08/17/2014 08/06/14   Donnita FallsMercedes Strupp Camprubi-Soms, PA-C  HYDROcodone-acetaminophen (NORCO) 5-325 MG per tablet Take 1 tablet by mouth every 6 (six) hours as needed for severe pain. Patient not taking: Reported on 08/17/2014 08/06/14   Donnita FallsMercedes Strupp Camprubi-Soms, PA-C  ibuprofen (ADVIL,MOTRIN) 600 MG tablet Take 1 tablet (600 mg total) by mouth every 8 (eight) hours as  needed for mild pain or moderate pain. 07/07/14   Mathis FareJennifer Lee H Presson, PA  meloxicam (MOBIC) 15 MG tablet Take 1 tablet (15 mg total) by mouth daily. Patient not taking: Reported on 08/17/2014 04/19/13   Rodolph BongEvan S Corey, MD  naproxen (NAPROSYN) 500 MG tablet Take 1 tablet (500 mg total) by mouth 2 (two) times daily as needed for mild pain, moderate pain or headache (TAKE WITH MEALS.). Patient not taking: Reported on 08/17/2014 08/06/14   Donnita FallsMercedes Strupp Camprubi-Soms, PA-C  omeprazole (PRILOSEC) 20 MG capsule Take 1 capsule (20 mg total) by mouth daily. Patient not taking: Reported on 08/17/2014 04/19/13   Rodolph BongEvan S Corey, MD  ondansetron (ZOFRAN) 8 MG tablet Take 1 tablet (8 mg total) by mouth every 8 (eight) hours as needed for nausea or vomiting. Patient not taking: Reported on 08/17/2014 08/06/14   Donnita FallsMercedes Strupp Camprubi-Soms, PA-C   BP 120/60 mmHg  Pulse 86  Temp(Src) 98.6 F (37 C)  Resp 16  Ht 5\' 10"  (1.778 m)  Wt 308 lb (139.708 kg)  BMI 44.19 kg/m2  SpO2 97% Physical Exam  Constitutional: He is oriented to person, place, and time. He appears well-developed and well-nourished. He appears distressed.  HENT:  Head: Normocephalic and atraumatic.  Nose: Nose normal.  Mouth/Throat: Oropharynx is clear and moist.  Patient has erythema, bulging, and effusion of the right TM.  There is no lymphadenopathy.  No mastoid tenderness or effusion  Eyes: Conjunctivae and EOM are normal. Pupils are equal,  round, and reactive to light.  Neck: Normal range of motion. Neck supple. No JVD present. No tracheal deviation present. No thyromegaly present.  Cardiovascular: Normal rate, regular rhythm, normal heart sounds and intact distal pulses.  Exam reveals no gallop and no friction rub.   No murmur heard. Pulmonary/Chest: Effort normal and breath sounds normal. No stridor. No respiratory distress. He has no wheezes. He has no rales. He exhibits no tenderness.  Abdominal: Soft. Bowel sounds are normal. He  exhibits no distension and no mass. There is no tenderness. There is no rebound and no guarding.  Musculoskeletal: Normal range of motion. He exhibits no edema or tenderness.  Lymphadenopathy:    He has no cervical adenopathy.  Neurological: He is alert and oriented to person, place, and time. He displays normal reflexes. He exhibits normal muscle tone. Coordination normal.  Skin: Skin is warm and dry. No rash noted. No erythema. No pallor.  Psychiatric: He has a normal mood and affect. His behavior is normal. Judgment and thought content normal.  Nursing note and vitals reviewed.   ED Course  Procedures (including critical care time) DIAGNOSTIC STUDIES: Oxygen Saturation is 97% on RA, adequate by my interpretation.    COORDINATION OF CARE: 3:20 AM- Pt advised of plan for treatment and pt agrees.    Labs Review Labs Reviewed - No data to display  Imaging Review No results found.   EKG Interpretation None      MDM   Final diagnoses:  Acute right otitis media, recurrence not specified, unspecified otitis media type    23 year old male with right otitis media, will start antibiotics.  Patient given precautions for return. I personally performed the services described in this documentation, which was scribed in my presence. The recorded information has been reviewed and is accurate.     Jorge Estes M Duanna Runk, MD 09/23/14 [id number]

## 2014-09-23 NOTE — Discharge Instructions (Signed)
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
## 2015-02-21 ENCOUNTER — Encounter (HOSPITAL_COMMUNITY): Payer: Self-pay | Admitting: *Deleted

## 2015-02-21 ENCOUNTER — Emergency Department (HOSPITAL_COMMUNITY)
Admission: EM | Admit: 2015-02-21 | Discharge: 2015-02-21 | Disposition: A | Discharge status: Home / Self Care | Payer: No Typology Code available for payment source | Attending: Emergency Medicine | Admitting: Emergency Medicine

## 2015-02-21 DIAGNOSIS — J45909 Unspecified asthma, uncomplicated: Secondary | ICD-10-CM | POA: Insufficient documentation

## 2015-02-21 DIAGNOSIS — Z792 Long term (current) use of antibiotics: Secondary | ICD-10-CM | POA: Insufficient documentation

## 2015-02-21 DIAGNOSIS — M545 Low back pain, unspecified: Secondary | ICD-10-CM

## 2015-02-21 DIAGNOSIS — Z720 Tobacco use: Secondary | ICD-10-CM | POA: Diagnosis not present

## 2015-02-21 MED ORDER — NAPROXEN 500 MG PO TABS: 500.0000 mg | ORAL_TABLET | Freq: Two times a day (BID) | ORAL | Status: DC

## 2015-02-21 MED ORDER — OXYCODONE-ACETAMINOPHEN 5-325 MG PO TABS
2.0000 | ORAL_TABLET | ORAL | Status: AC | PRN
Start: 2015-02-21 — End: ?

## 2015-02-21 MED ORDER — METHOCARBAMOL 500 MG PO TABS: 500.0000 mg | ORAL_TABLET | Freq: Two times a day (BID) | ORAL | Status: DC

## 2015-02-21 NOTE — ED Notes (Signed)
Pt c/o R lower back pain after playing basketball yesterday.  Able to ambulate in triage, but states difficulty getting out of bed.

## 2015-02-21 NOTE — Discharge Instructions (Signed)
Back Exercises Take medications as prescribed.  Avoid driving or operating heavy machinery while taking muscle relaxers or pain medications. Return for numbness or tingling in the lower extremities.  Back exercises help treat and prevent back injuries. The goal of back exercises is to increase the strength of your abdominal and back muscles and the flexibility of your back. These exercises should be started when you no longer have back pain. Back exercises include:  Pelvic Tilt. Lie on your back with your knees bent. Tilt your pelvis until the lower part of your back is against the floor. Hold this position 5 to 10 sec and repeat 5 to 10 times.  Knee to Chest. Pull first 1 knee up against your chest and hold for 20 to 30 seconds, repeat this with the other knee, and then both knees. This may be done with the other leg straight or bent, whichever feels better.  Sit-Ups or Curl-Ups. Bend your knees 90 degrees. Start with tilting your pelvis, and do a partial, slow sit-up, lifting your trunk only 30 to 45 degrees off the floor. Take at least 2 to 3 seconds for each sit-up. Do not do sit-ups with your knees out straight. If partial sit-ups are difficult, simply do the above but with only tightening your abdominal muscles and holding it as directed.  Hip-Lift. Lie on your back with your knees flexed 90 degrees. Push down with your feet and shoulders as you raise your hips a couple inches off the floor; hold for 10 seconds, repeat 5 to 10 times.  Back arches. Lie on your stomach, propping yourself up on bent elbows. Slowly press on your hands, causing an arch in your low back. Repeat 3 to 5 times. Any initial stiffness and discomfort should lessen with repetition over time.  Shoulder-Lifts. Lie face down with arms beside your body. Keep hips and torso pressed to floor as you slowly lift your head and shoulders off the floor. Do not overdo your exercises, especially in the beginning. Exercises may cause you  some mild back discomfort which lasts for a few minutes; however, if the pain is more severe, or lasts for more than 15 minutes, do not continue exercises until you see your caregiver. Improvement with exercise therapy for back problems is slow.  See your caregivers for assistance with developing a proper back exercise program. Document Released: 10/25/2004 Document Revised: 12/10/2011 Document Reviewed: 07/19/2011 [facility] Patient Information 2015 [place], [place]. This information is not intended to replace advice given to you by your health care provider. Make sure you discuss any questions you have with your health care provider.  Emergency Department Resource Guide 1) Find a Doctor and Pay Out of Pocket Although you won't have to find out who is covered by your insurance plan, it is a good idea to ask around and get recommendations. You will then need to call the office and see if the doctor you have chosen will accept you as a new patient and what types of options they offer for patients who are self-pay. Some doctors offer discounts or will set up payment plans for their patients who do not have insurance, but you will need to ask so you aren't surprised when you get to your appointment.  2) Contact Your Local Health Department Not all health departments have doctors that can see patients for sick visits, but many do, so it is worth a call to see if yours does. If you don't know where your local health department is,  you can check in your phone book. The CDC also has a tool to help you locate your state's health department, and many state websites also have listings of all of their local health departments.  3) Find a Walk-in Clinic If your illness is not likely to be very severe or complicated, you may want to try a walk in clinic. These are popping up all over the country in pharmacies, drugstores, and shopping centers. They're usually staffed by nurse practitioners or physician assistants that  have been trained to treat common illnesses and complaints. They're usually fairly quick and inexpensive. However, if you have serious medical issues or chronic medical problems, these are probably not your best option.  No Primary Care Doctor: - Call Health Connect at  [phone] - they can help you locate a primary care doctor that  accepts your insurance, provides certain services, etc. - Physician Referral Service- [phone]  Chronic Pain Problems: Organization         Address  Phone   Notes  [facility]  [phone] Patients need to be referred by their primary care doctor.   Medication Assistance: Organization         Address  Phone   Notes  [facility] [address] [place], [place] [address] [phone] --Must be a resident of [facility] -- Must have NO insurance coverage whatsoever (no Medicaid/ Medicare, etc.) -- The pt. MUST have a primary care doctor that directs their care regularly and follows them in the community   MedAssist  [phone]   [employer]  [phone]    Agencies that provide inexpensive medical care: Organization         Address  Phone   Notes  [facility]  [phone]   [facility]    [phone]   [facility] [address] [place], [place] [address] [phone]   [facility] [address], [place] [phone]   Planned Parenthood    [phone]   [facility]    [phone]   [facility]  [address], [place] Phone:  [phone], Fax:  [phone] Hours of Operation:  9 am - 6 pm, M-F.  Also accepts Medicaid/Medicare and self-pay.  [facility]  [address], [place] Phone: [phone], Fax: [phone]. Hours of Operation:  8:30 am - 5:30 pm, M-F.  Also accepts Medicaid and  self-pay.  [facility] [place] [address], [place] Phone: [phone]) [phone]   [facility] [address], [place], [place] [phone], Ext. [phone] Mondays & Thursdays: 7-9 AM.  First 15 patients are seen on a first come, first serve basis.    Medicaid-accepting [facility] Providers:  Organization         Address  Phone   Notes  [facility] [address], [place] [phone] Also accepts self-pay patients.  [facility] [address] [place], [place]  [phone]   [facility] [address], [place] [phone]) [phone]   [facility] [address] [place] [address], [place] [phone]   Renaye RakersVeita Bland [address], [place]   [phone] Only accepts [place] Access [place] patients after they have their name  applied to their card.   Self-Pay (no insurance) in [facility]:  Organization         Address  Phone   Notes  Sickle Cell Patients, [facility] [place] [phone]   [facility] [place] [phone]   [facility] [place]  [place], [place], [place] [phone]   [facility]/Dr. Osei-Bonsu  [address] [place] or [address], [place] [phone] Phone number for both [place] and [place] locations is the same.  [facility] [address], [place] [phone]   [facility] [address], [place] or [address] [phone]   [facility] [address], [place] [phone], phone; [phone], fax Sees patients 1st and 3rd Saturday of every month.  Must not qualify for public or private insurance (i.e. Medicaid, Medicare, [place] Health Choice, Veterans' Benefits)  Household income  should be no more than 200% of the poverty level The clinic cannot treat you if you are pregnant or think you are pregnant  Sexually transmitted diseases are not treated at the clinic.    Dental Care: Organization         Address  Phone  Notes  [facility] Department of [facility] [phone] Accepts children up to age 47 who are enrolled in [place] or [place]; pregnant women with a Medicaid card; and children who have applied for Medicaid or [place] Health Choice, but were declined, whose parents can pay a reduced fee at time of service.  [facility]  [address], [place] [phone] Accepts children up to age 88 who are enrolled in [place] or [place]; pregnant women with a Medicaid card; and children who have applied for Medicaid or [place] Health Choice, but were declined, whose parents can pay a reduced fee at time of service.  [facility] PRO[facility]  [place] [phone] Patients are seen by appointment only. Walk-ins are not accepted. [place] will see patients 47 years of age and older. Monday - Tuesday (8am-5pm) Most Wednesdays (8:30-5pm) $30 per visit, cash only  [facility] Adult [facility] Access PROGRAM  [address], [facility] [phone] Patients are seen by appointment only. Walk-ins are not accepted. [place] will see patients 72 years of age and older. One Wednesday Evening (Monthly: Volunteer Based).  $30 per visit, cash only  [place]  [phone] for adults; Children under age 72, call Graduate Pediatric Dentistry at [phone]. Children aged 58-14, please call [phone] to request a pediatric application.  Dental services are provided in all areas of dental care including fillings, crowns and bridges, complete and partial dentures, implants, gum treatment,  root canals, and extractions. Preventive care is also provided. Treatment is provided to both adults and children. Patients are selected via a lottery and there is often a waiting list.   [facility] [address], [place]  [phone] [email or web address]   [facility] [address] [place], [place] [phone], Ext. [phone] Second and Fourth Thursday of each month, opens at 6:30 AM; Clinic ends at 9 AM.  Patients are seen on a first-come  first-served basis, and a limited number are seen during each clinic.   [facility]  [address] Hillard Danker [place], [place] [phone]   Eligibility Requirements You must have lived in [place], [place], or [place] counties for at least the last three months.   You cannot be eligible for state or federal sponsored [employer], including [employer], [place], or [employer].   You generally cannot be eligible for healthcare insurance through your employer.    How to apply: Eligibility screenings are held every Tuesday and Wednesday afternoon from 1:00 pm until 4:00 pm. You do not need an appointment for the interview!  [facility] [address] [place], [place]   [place]  [place] Department  [place]  [phone]    Behavioral Health Resources in the Community: Intensive Outpatient Programs Organization         Address  Phone  Notes  [place]. [address] [place], [place] [phone]   [facility] [address] [place], [place]   ADS: Alcohol [facility] Drug S[facility]cs [address] [place], [place]   [place] [address],  [place], [place] or [phone]   Substance Abuse Resources Organization         Address  Phone  Notes  Alcohol and Drug Services   [phone]   [place]  [phone]   The [place]   Chinita Pester  [phone]   Residential & Outpatient Substance Abuse Program  [phone]   Psychological Services Organization         Address  Phone  Notes  [facility] [place]  [place]  [phone]   [place] [address] [place] or [phone]    Mobile Crisis Teams Organization         Address  Phone  Notes  [facility], Mobile Crisis Care Unit  [phone]   [facility]  [address] [place], [place]   [place] [address], [place] [phone]    Self-Help/Support Groups Organization         Address  Phone             Notes  [place]. of [place] - variety of support groups  [place] Call for more information  Narcotics Anonymous (NA), [facility]aring [facility] [address], [employer] [place]  2 meetings at this location   Special educational needs teacher         Address  Phone  Notes  [facility]idential [facility] [place],    [place]  [phone]   [facility]  [address], [place] 388828, [place], [place]   [place], [place] [phone] Admissions: 8am-3pm M-F  Incentives Substance [place] [address],    [place], [place] [phone]   [facility] [address] Jadene Pierini [place], [place]   [facility] [address]  [place], [place]   Insight Programs - Intensive Outpatient [address], [place], [place]   [facility] ([place].,  [place], [place] or [phone]   [facility] Services (RTS) [address] [place], [place] Accepts Medicaid  Fellowship [address]  [place] [phone] Substance Abuse/Addiction Treatment   [facility] Organization         Address  Phone  Notes  [facility]  [phone]   Domenic Schwab, PhD [address] [place], [place]   [phone] or [phone]   [place], [place] [phone]   [address], [place], [place] [phone] Insurance/Medicaid/sponsorship through [facility] and Families [address] [place]                                    [place], [place] [phone] [place] [address] Drive[place], [place] [phone]    Dr. Adele Schilder  [phone]   [facility] Dept. 1) [address], [place] 2) [place] 3)  [place] [address], [place] [phone]  [phone]   [place] [phone] or [phone] (After Hours)

## 2015-02-21 NOTE — ED Provider Notes (Signed)
CSN: [id number]     Arrival date & time 02/21/15  1242 History   None    Chief Complaint  Patient presents with  . Back Pain     (Consider location/radiation/quality/duration/timing/severity/associated sxs/prior Treatment) Patient is a 24 y.o. male presenting with back pain. The history is provided by the patient. No language interpreter was used.  Back Pain Associated symptoms: no abdominal pain, no headaches, no numbness and no weakness   Mr. Iva BoopDonnell-Wall is a 24 y.o male with history of asthma who presents with back pain while playing basketball yesterday.  He states he threw the ball up and when he landed on his feet he felt pain on the right side of his back. He denies falling.  He states he took ibuprofen with little relief.  Getting up from a supine position to standing makes it worse.  He denies any fever, bowel or bladder incontinence or retention.  He denies prior back injury or surgery. Denies recent steroid use. Denies IV drug use.   Past Medical History  Diagnosis Date  . Asthma    History reviewed. No pertinent past surgical history. No family history on file. History  Substance Use Topics  . Smoking status: Current Some Day Smoker    Types: Cigarettes  . Smokeless tobacco: Not on file  . Alcohol Use: No    Review of Systems  Gastrointestinal: Negative for abdominal pain.  Genitourinary: Negative for difficulty urinating.  Musculoskeletal: Positive for back pain. Negative for neck pain.  Neurological: Negative for weakness, numbness and headaches.      Allergies  Review of patient's allergies indicates no known allergies.  Home Medications   Prior to Admission medications   Medication Sig Start Date End Date Taking? Authorizing Provider  amoxicillin (AMOXIL) 500 MG capsule Take 1 capsule (500 mg total) by mouth 2 (two) times daily. 09/23/14   Marisa Severinlga Otter, MD  cyclobenzaprine (FLEXERIL) 10 MG tablet Take 1 tablet (10 mg total) by mouth 3 (three) times daily  as needed for muscle spasms. Patient not taking: Reported on 08/17/2014 08/06/14   Mercedes Camprubi-Soms, PA-C  HYDROcodone-acetaminophen (NORCO) 5-325 MG per tablet Take 1 tablet by mouth every 6 (six) hours as needed for severe pain. 09/23/14   Marisa Severinlga Otter, MD  ibuprofen (ADVIL,MOTRIN) 600 MG tablet Take 1 tablet (600 mg total) by mouth every 8 (eight) hours as needed for mild pain or moderate pain. 09/23/14   Marisa Severinlga Otter, MD  meloxicam (MOBIC) 15 MG tablet Take 1 tablet (15 mg total) by mouth daily. Patient not taking: Reported on 08/17/2014 04/19/13   Rodolph BongEvan S Corey, MD  methocarbamol (ROBAXIN) 500 MG tablet Take 1 tablet (500 mg total) by mouth 2 (two) times daily. 02/21/15   Maya Arcand Patel-Mills, PA-C  naproxen (NAPROSYN) 500 MG tablet Take 1 tablet (500 mg total) by mouth 2 (two) times daily. 02/21/15   Hanny Elsberry Patel-Mills, PA-C  omeprazole (PRILOSEC) 20 MG capsule Take 1 capsule (20 mg total) by mouth daily. Patient not taking: Reported on 08/17/2014 04/19/13   Rodolph BongEvan S Corey, MD  ondansetron (ZOFRAN) 8 MG tablet Take 1 tablet (8 mg total) by mouth every 8 (eight) hours as needed for nausea or vomiting. Patient not taking: Reported on 08/17/2014 08/06/14   Mercedes Camprubi-Soms, PA-C  oxyCODONE-acetaminophen (PERCOCET/ROXICET) 5-325 MG per tablet Take 2 tablets by mouth every 4 (four) hours as needed for severe pain. 02/21/15   Javia Dillow Patel-Mills, PA-C   BP 118/68 mmHg  Pulse 78  Temp(Src) 98.4 F (36.9 C) (  Oral)  Resp 16  Ht 5' 9.5" (1.765 m)  Wt 340 lb (154.223 kg)  BMI 49.51 kg/m2  SpO2 94% Physical Exam  Constitutional: He is oriented to person, place, and time. He appears well-developed and well-nourished.  Cardiovascular: Normal rate, regular rhythm and normal heart sounds.   Pulmonary/Chest: Effort normal and breath sounds normal.  Musculoskeletal:  Ambulating in room. No midline lumbar tenderness.  Right sided paravertebral tenderness on palpation.  Negative straight leg raise.  Able to  twist at the hips without difficulty.   Neurological: He is alert and oriented to person, place, and time.  Skin: Skin is warm and dry.  Nursing note and vitals reviewed.   ED Course  Procedures (including critical care time) Labs Review Labs Reviewed - No data to display  Imaging Review No results found.   EKG Interpretation None      MDM   Final diagnoses:  Right-sided low back pain without sciatica   Patient presents for back pain while playing basketball yesterday.  Denies any injury or fall.  No focal deficits or weakness.  No numbness or tingling in lower extremities.  No midline tenderness. No saddle anesthesia or bowel or bladder incontinence or retention.  I have prescribed robaxin, naproxen and percocet.  I discussed return precautions and gave him a work note.     Catha Gosselin, PA-C 02/21/15 1321  Gwyneth Sprout, MD 02/22/15 1756

## 2015-02-21 NOTE — ED Notes (Signed)
Pt was playing basket Jorge Estes yesterday. Developed right lower back pain which radiates to right buttock.

## 2019-04-19 ENCOUNTER — Emergency Department (HOSPITAL_COMMUNITY)
Admission: EM | Admit: 2019-04-19 | Discharge: 2019-04-19 | Disposition: A | Discharge status: Home / Self Care | Payer: No Typology Code available for payment source | Attending: Emergency Medicine | Admitting: Emergency Medicine

## 2019-04-19 ENCOUNTER — Other Ambulatory Visit: Payer: Self-pay

## 2019-04-19 ENCOUNTER — Encounter (HOSPITAL_COMMUNITY): Payer: Self-pay

## 2019-04-19 DIAGNOSIS — J45909 Unspecified asthma, uncomplicated: Secondary | ICD-10-CM | POA: Insufficient documentation

## 2019-04-19 DIAGNOSIS — F17210 Nicotine dependence, cigarettes, uncomplicated: Secondary | ICD-10-CM | POA: Insufficient documentation

## 2019-04-19 DIAGNOSIS — M5441 Lumbago with sciatica, right side: Secondary | ICD-10-CM | POA: Insufficient documentation

## 2019-04-19 MED ORDER — PREDNISONE 10 MG (21) PO TBPK: ORAL_TABLET | ORAL | 0 refills | Status: AC

## 2019-04-19 MED ORDER — METHOCARBAMOL 500 MG PO TABS: 500.0000 mg | ORAL_TABLET | Freq: Two times a day (BID) | ORAL | 0 refills | Status: AC

## 2019-04-19 MED ORDER — LIDOCAINE 5 % EX PTCH: 1.0000 | MEDICATED_PATCH | CUTANEOUS | 0 refills | Status: AC

## 2019-04-19 NOTE — ED Provider Notes (Signed)
[facility]-EMERGENCY DEPT Provider Note   CSN: [id number] Arrival date & time: 04/19/19  1102    History   Chief Complaint Chief Complaint  Patient presents with  . Back Pain    HPI Jorge Estes is a 28 y.o. male.     HPI   Jorge Estes is a 28 y.o. male, with a history of asthma and obesity, presenting to the ED with back pain for the last 3 days.  Pain is on the right side, described as a tightness with intermittent pain shooting into the right buttock, moderate in intensity.  States he has not had this issue before.  Accompanied by tingling into the right buttock.  Denies fever/chills, changes in bowel or bladder function, saddle anesthesias, weakness, loss in sensation, falls/trauma, or any other complaints.    Past Medical History:  Diagnosis Date  . Asthma     There are no active problems to display for this patient.   History reviewed. No pertinent surgical history.      Home Medications    Prior to Admission medications   Medication Sig Start Date End Date Taking? Authorizing Provider  amoxicillin (AMOXIL) 500 MG capsule Take 1 capsule (500 mg total) by mouth 2 (two) times daily. 09/23/14   Marisa Severintter, Olga, MD  cyclobenzaprine (FLEXERIL) 10 MG tablet Take 1 tablet (10 mg total) by mouth 3 (three) times daily as needed for muscle spasms. Patient not taking: Reported on 08/17/2014 08/06/14   Street, ParkerMercedes, PA-C  HYDROcodone-acetaminophen (NORCO) 5-325 MG per tablet Take 1 tablet by mouth every 6 (six) hours as needed for severe pain. 09/23/14   Marisa Severintter, Olga, MD  ibuprofen (ADVIL,MOTRIN) 600 MG tablet Take 1 tablet (600 mg total) by mouth every 8 (eight) hours as needed for mild pain or moderate pain. 09/23/14   Marisa Severintter, Olga, MD  lidocaine (LIDODERM) 5 % Place 1 patch onto the skin daily. Remove & Discard patch within 12 hours or as directed by MD 04/19/19   Jerricka Carvey, Hillard DankerShawn C, PA-C  meloxicam (MOBIC) 15 MG tablet Take 1  tablet (15 mg total) by mouth daily. Patient not taking: Reported on 08/17/2014 04/19/13   Rodolph Bongorey, Evan S, MD  methocarbamol (ROBAXIN) 500 MG tablet Take 1 tablet (500 mg total) by mouth 2 (two) times daily. 04/19/19   Zarie Kosiba C, PA-C  omeprazole (PRILOSEC) 20 MG capsule Take 1 capsule (20 mg total) by mouth daily. Patient not taking: Reported on 08/17/2014 04/19/13   Rodolph Bongorey, Evan S, MD  ondansetron (ZOFRAN) 8 MG tablet Take 1 tablet (8 mg total) by mouth every 8 (eight) hours as needed for nausea or vomiting. Patient not taking: Reported on 08/17/2014 08/06/14   Street, [place]cedes, [place]PA-C  oxyCODONE-acetaminophen (PERCOCET/ROXICET) 5-325 MG per tablet Take 2 tablets by mouth every 4 (four) hours as needed for severe pain. 02/21/15   Patel-Mills, Lorelle FormosaHanna, PA-C  predniSONE (STERAPRED UNI-PAK 21 TAB) 10 MG (21) TBPK tablet Take 6 tabs (60mg ) day 1, 5 tabs (50mg ) day 2, 4 tabs (40mg ) day 3, 3 tabs (30mg ) day 4, 2 tabs (20mg ) day 5, and 1 tab (10mg ) day 6. 04/19/19   Baker Moronta, Hillard DankerShawn C, PA-C    Family History History reviewed. No pertinent family history.  Social History Social History   Tobacco Use  . Smoking status: Current Some Day Smoker    Types: Cigarettes  . Smokeless tobacco: Never Used  Substance Use Topics  . Alcohol use: No  . Drug use: No  Allergies   Patient has no known allergies.   Review of Systems Review of Systems  Constitutional: Negative for fever.  Gastrointestinal: Negative for nausea and vomiting.  Genitourinary: Negative for decreased urine volume and difficulty urinating.  Musculoskeletal: Positive for back pain.  Neurological: Negative for weakness and numbness.     Physical Exam Updated Vital Signs BP (!) 165/100 (BP Location: Right Arm)   Pulse 80   Temp 99.2 F (37.3 C) (Oral)   Resp 20   Ht 6' (1.829 m)   Wt (!) 154 kg   SpO2 100%   BMI 46.05 kg/m   Physical Exam Vitals signs and nursing note reviewed.  Constitutional:      General: He is not in  acute distress.    Appearance: He is well-developed. He is obese. He is not diaphoretic.  HENT:     Head: Normocephalic and atraumatic.  Eyes:     Conjunctiva/sclera: Conjunctivae normal.  Neck:     Musculoskeletal: Neck supple.  Cardiovascular:     Rate and Rhythm: Normal rate and regular rhythm.     Pulses:          Dorsalis pedis pulses are 2+ on the right side and 2+ on the left side.       Posterior tibial pulses are 2+ on the right side and 2+ on the left side.  Pulmonary:     Effort: Pulmonary effort is normal.  Musculoskeletal:       Back:     Comments: Tenderness to the right lumbar musculature into the right buttocks. Normal motor function intact in all extremities. No midline spinal tenderness.   Skin:    General: Skin is warm and dry.     Coloration: Skin is not pale.  Neurological:     Mental Status: He is alert.     Comments: Sensation grossly intact to light touch in the lower extremities bilaterally. No saddle anesthesias. Strength 5/5 in the bilateral lower extremities. No noted gait deficit. Coordination intact with heel to shin testing.  Psychiatric:        Behavior: Behavior normal.      ED Treatments / Results  Labs (all labs ordered are listed, but only abnormal results are displayed) Labs Reviewed - No data to display  EKG None  Radiology No results found.  Procedures Procedures (including critical care time)  Medications Ordered in ED Medications - No data to display   Initial Impression / Assessment and Plan / ED Course  I have reviewed the triage vital signs and the nursing notes.  Pertinent labs & imaging results that were available during my care of the patient were reviewed by me and considered in my medical decision making (see chart for details).        Patient presents with unilateral lower back pain.  Symptoms are suggestive of sciatica.  No red flag symptoms.  No findings suggestive of cauda equina or other neurosurgical  emergency. The patient was given instructions for home care as well as return precautions. Patient voices understanding of these instructions, accepts the plan, and is comfortable with discharge.   Final Clinical Impressions(s) / ED Diagnoses   Final diagnoses:  Acute right-sided low back pain with right-sided sciatica    ED Discharge Orders         Ordered    predniSONE (STERAPRED UNI-PAK 21 TAB) 10 MG (21) TBPK tablet     04/19/19 1313    methocarbamol (ROBAXIN) 500 MG tablet  2 times  daily     04/19/19 1313    lidocaine (LIDODERM) 5 %  Every 24 hours     04/19/19 [place], Jaleen Finch C, PA-C 04/19/19 1313    Milton Ferguson, MD 04/22/19 1825

## 2019-04-19 NOTE — ED Triage Notes (Signed)
States lower back pain for 3 days and numbness in legs able to bear weight no bowel/bladder problems no other complaints voiced.

## 2019-04-19 NOTE — Discharge Instructions (Addendum)
Expect your soreness to increase over the next 2-3 days. Take it easy, but do not lay around too much as this may make any stiffness worse.  Antiinflammatory medications: Take 600 mg of ibuprofen every 6 hours or 440 mg (over the counter dose) to 500 mg (prescription dose) of naproxen every 12 hours for the next 3 days. After this time, these medications may be used as needed for pain. Take these medications with food to avoid upset stomach. Choose only one of these medications, do not take them together. Acetaminophen (generic for Tylenol): Should you continue to have additional pain while taking the ibuprofen or naproxen, you may add in acetaminophen as needed. Your daily total maximum amount of acetaminophen from all sources should be limited to 4000mg /day for persons without liver problems, or 2000mg /day for those with liver problems. Methocarbamol: Methocarbamol (generic for Robaxin) is a muscle relaxer and can help relieve stiff muscles or muscle spasms.  Do not drive or perform other dangerous activities while taking this medication as it can cause drowsiness as well as changes in reaction time and judgement. Lidocaine patches: These are available via either prescription or over-the-counter. The over-the-counter option may be more economical one and are likely just as effective. There are multiple over-the-counter brands, such as Salonpas. Prednisone: Take the prednisone, as prescribed, until finished. Exercises: Be sure to perform the attached exercises starting with three times a week and working up to performing them daily. This is an essential part of preventing long term problems.  Follow up: Follow up with a primary care provider or orthopedic specialist for any future management of these complaints. Be sure to follow up within 7-10 days. Return: Return to the ED should symptoms worsen.  For prescription assistance, may try using prescription discount sites or apps, such as goodrx.com
